# Patient Record
Sex: Male | Born: 2005
Health system: Southern US, Community
[De-identification: ages and names within clinical notes are randomized; demographics above are authoritative.]

## PROBLEM LIST (undated history)

## (undated) DIAGNOSIS — Z889 Allergy status to unspecified drugs, medicaments and biological substances status: Secondary | ICD-10-CM

## (undated) DIAGNOSIS — F32A Depression, unspecified: Secondary | ICD-10-CM

---
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
## 2023-08-31 ENCOUNTER — Emergency Department (HOSPITAL_BASED_OUTPATIENT_CLINIC_OR_DEPARTMENT_OTHER)
Admission: EM | Admit: 2023-08-31 | Discharge: 2023-08-31 | Disposition: A | Discharge status: Home / Self Care | Attending: Emergency Medicine | Admitting: Emergency Medicine

## 2023-08-31 ENCOUNTER — Encounter (HOSPITAL_BASED_OUTPATIENT_CLINIC_OR_DEPARTMENT_OTHER): Payer: Self-pay | Admitting: Emergency Medicine

## 2023-08-31 DIAGNOSIS — R42 Dizziness and giddiness: Secondary | ICD-10-CM | POA: Diagnosis present

## 2023-08-31 DIAGNOSIS — R55 Syncope and collapse: Secondary | ICD-10-CM | POA: Diagnosis not present

## 2023-08-31 HISTORY — DX: Depression, unspecified: F32.A

## 2023-08-31 LAB — COMPREHENSIVE METABOLIC PANEL WITH GFR
ALT: 11 U/L (ref 0–44)
AST: 24 U/L (ref 15–41)
Albumin: 4.7 g/dL (ref 3.5–5.0)
Alkaline Phosphatase: 83 U/L (ref 38–126)
Anion gap: 13 (ref 5–15)
BUN: 15 mg/dL (ref 6–20)
CO2: 24 mmol/L (ref 22–32)
Calcium: 10.2 mg/dL (ref 8.9–10.3)
Chloride: 100 mmol/L (ref 98–111)
Creatinine, Ser: 1.06 mg/dL (ref 0.61–1.24)
GFR, Estimated: >60 mL/min (ref >60)
Glucose, Bld: 84 mg/dL (ref 70–99)
Potassium: 4 mmol/L (ref 3.5–5.1)
Sodium: 137 mmol/L (ref 135–145)
Total Bilirubin: 1.4 mg/dL — ABNORMAL HIGH (ref 0.0–1.2)
Total Protein: 7.5 g/dL (ref 6.5–8.1)

## 2023-08-31 LAB — URINALYSIS, ROUTINE W REFLEX MICROSCOPIC
Bacteria, UA: NONE SEEN
Bilirubin Urine: NEGATIVE
Glucose, UA: NEGATIVE mg/dL
Hgb urine dipstick: NEGATIVE
Ketones, ur: NEGATIVE mg/dL
Leukocytes,Ua: NEGATIVE
Nitrite: NEGATIVE
Protein, ur: NEGATIVE mg/dL
Specific Gravity, Urine: 1.016 (ref 1.005–1.030)
pH: 6.5 (ref 5.0–8.0)

## 2023-08-31 LAB — CBC
HCT: 46.4 % (ref 39.0–52.0)
Hemoglobin: 15.6 g/dL (ref 13.0–17.0)
MCH: 28.6 pg (ref 26.0–34.0)
MCHC: 33.6 g/dL (ref 30.0–36.0)
MCV: 85.1 fL (ref 80.0–100.0)
Platelets: 267 10*3/uL (ref 150–400)
RBC: 5.45 MIL/uL (ref 4.22–5.81)
RDW: 12.2 % (ref 11.5–15.5)
WBC: 5.8 10*3/uL (ref 4.0–10.5)
nRBC: 0 % (ref 0.0–0.2)

## 2023-08-31 LAB — D-DIMER, QUANTITATIVE: D-Dimer, Quant: <0.27 ug{FEU}/mL (ref 0.00–0.50)

## 2023-08-31 MED ORDER — SODIUM CHLORIDE 0.9 % IV BOLUS
1000.0000 mL | Freq: Once | INTRAVENOUS | Status: AC
  Administered 2023-08-31: 1000 mL via INTRAVENOUS

## 2023-08-31 NOTE — ED Provider Notes (Signed)
 [place] EMERGENCY DEPARTMENT AT [facility] Provider Note   CSN: [id number] Arrival date & time: 08/31/23  1225     History  Chief Complaint  Patient presents with   Near Syncope    Kymere Fullington is a 18 y.o. male with no significant past medical history who presents to the ED today for lightheadedness.  Patient endorses feelings of lightheadedness and chest pains, worse with standing, for the past week.  States that he got home from [place] last week for football.  Mother at bedside states that she the patient was dehydrated he has been increasing his fluid intake since onset of symptoms.  Patient states that his symptoms been starting to improve slightly. Endorses some nausea but no vomiting. No LOC.    Home Medications Prior to Admission medications   Medication Sig Start Date End Date Taking? Authorizing Provider  acetaminophen  (TYLENOL ) 325 MG tablet Take 2 tablets (650 mg total) by mouth every 6 (six) hours as needed for mild pain or moderate pain. 03/01/17   Jannine Meo, NP  escitalopram (LEXAPRO) 10 MG tablet Take 10 mg by mouth daily.    [provider]  ibuprofen  (ADVIL ,MOTRIN ) 200 MG tablet Take 600 mg by mouth every 6 (six) hours as needed for mild pain.    [provider]  ibuprofen  (ADVIL ,MOTRIN ) 600 MG tablet Take 1 tablet (600 mg total) by mouth every 6 (six) hours as needed for mild pain or moderate pain. 03/01/17   Jannine Meo, NP      Allergies    Patient has no known allergies.    Review of Systems   Review of Systems  Neurological:  Positive for light-headedness.  All other systems reviewed and are negative.   Physical Exam Updated Vital Signs BP 129/70 (BP Location: Right Arm)   Pulse 96   Temp 98 F (36.7 C) (Oral)   Resp 13   Ht 6\' 1"  (1.854 m)   Wt 99.8 kg   SpO2 100%   BMI 29.03 kg/m  Physical Exam Vitals and nursing note reviewed.  Constitutional:      General: He is not in acute distress.     Appearance: Normal appearance.  HENT:     Head: Normocephalic and atraumatic.     Mouth/Throat:     Mouth: Mucous membranes are moist.  Eyes:     Conjunctiva/sclera: Conjunctivae normal.     Pupils: Pupils are equal, round, and reactive to light.  Cardiovascular:     Rate and Rhythm: Normal rate and regular rhythm.     Pulses: Normal pulses.     Heart sounds: Normal heart sounds.  Pulmonary:     Effort: Pulmonary effort is normal.     Breath sounds: Normal breath sounds.  Abdominal:     Palpations: Abdomen is soft.     Tenderness: There is no abdominal tenderness.  Skin:    General: Skin is warm and dry.     Findings: No rash.  Neurological:     General: No focal deficit present.     Mental Status: He is alert.     Sensory: No sensory deficit.     Motor: No weakness.  Psychiatric:        Mood and Affect: Mood normal.        Behavior: Behavior normal.    Orthostatic Vital Signs Orthostatic Lying BP- Lying: 115/55 Pulse- Lying: 65 Orthostatic Sitting BP- Sitting: 121/62 Pulse- Sitting: 64 Orthostatic Standing at 0 minutes BP- Standing at  0 minutes: 122/64 Pulse- Standing at 0 minutes: 84 Orthostatic Standing at 3 minutes BP- Standing at 3 minutes: 129/70 Pulse- Standing at 3 minutes: 81   ED Results / Procedures / Treatments   Labs (all labs ordered are listed, but only abnormal results are displayed) Labs Reviewed  COMPREHENSIVE METABOLIC PANEL WITH GFR - Abnormal; Notable for the following components:      Result Value   Total Bilirubin 1.4 (*)    All other components within normal limits  CBC  URINALYSIS, ROUTINE W REFLEX MICROSCOPIC  D-DIMER, QUANTITATIVE    EKG None  Radiology No results found.  Procedures Procedures    Medications Ordered in ED Medications  sodium chloride 0.9 % bolus 1,000 mL (0 mLs Intravenous Stopped 08/31/23 1612)    ED Course/ Medical Decision Making/ A&P                                 Medical Decision  Making Amount and/or Complexity of Data Reviewed Labs: ordered.   This patient presents to the ED for concern of lightheadedness, this involves an extensive number of treatment options, and is a complaint that carries with it a high risk of complications and morbidity.   Differential diagnosis includes: dehydration, electrolyte derangement, cardiac arrhythmia, orthostatic hypotension, pulmonary embolism, vertigo, anxiety, etc.   Comorbidities  No significant past medical history   Additional History  Additional history obtained from patient and mother at bedside.   Cardiac Monitoring / EKG  The patient was maintained on a cardiac monitor.  I personally viewed and interpreted the cardiac monitored which showed: sinus rhythm with a heart rate of 69 bpm.   Lab Tests  I ordered and personally interpreted labs.  The pertinent results include:   CMP and CBC are reassuring UA is unremarkable Negative D-dimer   Problem List / ED Course / Critical Interventions / Medication Management  Patient endorses intermittent episodes of lightheadedness, worse when going from sitting to standing for the past week. Patient was recently in [place] for football and got back last week.  He reports he is having some chest pain with the lightheadedness.  Negative D-dimer. Orthostatic vital signs obtained.  When getting blood pressure for 3 minutes he looked at the monitor and then started feeling lightheaded.  He thinks it could be because of nerves.  Fluids given and he felt better.  Recheck blood pressure at 3 minutes and it was within normal limits.  No orthostatic hypotension.  Symptoms most likely related to dehydration and/or anxiety. Discussed plans with patient and mother at bedside.  All questions answered.  Social Determinants of Health  Physical activity   Test / Admission - Considered  He is stable and safe discharge home. Return to precautions given.       Final Clinical  Impression(s) / ED Diagnoses Final diagnoses:  Episodic lightheadedness    Rx / DC Orders ED Discharge Orders     None         Sonnie Dusky, PA-C 08/31/23 1718    Auston Blush, MD 09/04/23 1059

## 2023-08-31 NOTE — Discharge Instructions (Signed)
 As discussed, your labs are reassuring.  You lightheadedness could be due to to dehydration.  Make sure you stay hydrated throughout the day.  Follow-up with your primary care provider in the next week for reevaluation of your symptoms.  Get help right away if: You vomit each time you eat or drink. You have watery poop and can't eat or drink. You have trouble talking, walking, swallowing, or using your arms, hands, or legs. You feel very weak. You're bleeding. You're not thinking clearly, or you have trouble forming sentences. A friend or family member may spot this. Your vision changes, or you get a very bad headache.

## 2023-08-31 NOTE — ED Triage Notes (Signed)
 Intermittent light headedness/ near syncopal since Saturday. Last about 15 minutes  Recent travel to [place]  to play football
